# Patient Record
Sex: Female | Born: 1975 | Race: Black or African American | Hispanic: No | Marital: Married | Smoking: Never smoker
Health system: Southern US, Community
[De-identification: ages and names within clinical notes are randomized; demographics above are authoritative.]

## PROBLEM LIST (undated history)

## (undated) DIAGNOSIS — R51 Headache: Secondary | ICD-10-CM

## (undated) DIAGNOSIS — R519 Headache, unspecified: Secondary | ICD-10-CM

## (undated) DIAGNOSIS — J45909 Unspecified asthma, uncomplicated: Secondary | ICD-10-CM

## (undated) HISTORY — PX: TUBAL LIGATION: SHX77

## (undated) HISTORY — DX: Headache: R51

## (undated) HISTORY — DX: Headache, unspecified: R51.9

---

## 2010-05-08 ENCOUNTER — Emergency Department (INDEPENDENT_AMBULATORY_CARE_PROVIDER_SITE_OTHER): Payer: BC Managed Care – PPO

## 2010-05-08 ENCOUNTER — Emergency Department (HOSPITAL_BASED_OUTPATIENT_CLINIC_OR_DEPARTMENT_OTHER)
Admission: EM | Admit: 2010-05-08 | Discharge: 2010-05-08 | Disposition: A | Discharge status: Home / Self Care | Payer: BC Managed Care – PPO | Attending: Emergency Medicine | Admitting: Emergency Medicine

## 2010-05-08 DIAGNOSIS — R0789 Other chest pain: Secondary | ICD-10-CM | POA: Insufficient documentation

## 2010-05-08 DIAGNOSIS — R0602 Shortness of breath: Secondary | ICD-10-CM | POA: Insufficient documentation

## 2010-05-08 DIAGNOSIS — K224 Dyskinesia of esophagus: Secondary | ICD-10-CM | POA: Insufficient documentation

## 2010-05-08 DIAGNOSIS — R079 Chest pain, unspecified: Secondary | ICD-10-CM

## 2010-05-08 DIAGNOSIS — K2990 Gastroduodenitis, unspecified, without bleeding: Secondary | ICD-10-CM | POA: Insufficient documentation

## 2010-05-08 DIAGNOSIS — J45909 Unspecified asthma, uncomplicated: Secondary | ICD-10-CM | POA: Insufficient documentation

## 2010-05-08 DIAGNOSIS — K2970 Gastritis, unspecified, without bleeding: Secondary | ICD-10-CM | POA: Insufficient documentation

## 2010-05-08 LAB — CBC
MCH: 28.4 pg (ref 26.0–34.0)
MCHC: 34 g/dL (ref 30.0–36.0)
MCV: 83.6 fL (ref 78.0–100.0)
Platelets: 276 10*3/uL (ref 150–400)
RBC: 4.22 MIL/uL (ref 3.87–5.11)

## 2010-05-08 LAB — DIFFERENTIAL
Basophils Relative: 0 % (ref 0–1)
Eosinophils Absolute: 0.1 10*3/uL (ref 0.0–0.7)
Eosinophils Relative: 2 % (ref 0–5)
Lymphs Abs: 1.2 10*3/uL (ref 0.7–4.0)
Monocytes Absolute: 0.3 10*3/uL (ref 0.1–1.0)
Monocytes Relative: 7 % (ref 3–12)
Neutrophils Relative %: 65 % (ref 43–77)

## 2010-05-08 LAB — BASIC METABOLIC PANEL
BUN: 15 mg/dL (ref 6–23)
CO2: 23 mEq/L (ref 19–32)
Chloride: 112 mEq/L (ref 96–112)
Creatinine, Ser: 0.6 mg/dL (ref 0.4–1.2)
Glucose, Bld: 104 mg/dL — ABNORMAL HIGH (ref 70–99)
Potassium: 4.3 mEq/L (ref 3.5–5.1)

## 2010-05-08 LAB — D-DIMER, QUANTITATIVE: D-Dimer, Quant: 0.26 ug/mL-FEU (ref 0.00–0.48)

## 2010-07-13 ENCOUNTER — Emergency Department (INDEPENDENT_AMBULATORY_CARE_PROVIDER_SITE_OTHER): Payer: BC Managed Care – PPO

## 2010-07-13 ENCOUNTER — Emergency Department (HOSPITAL_BASED_OUTPATIENT_CLINIC_OR_DEPARTMENT_OTHER)
Admission: EM | Admit: 2010-07-13 | Discharge: 2010-07-13 | Disposition: A | Discharge status: Home / Self Care | Payer: BC Managed Care – PPO | Attending: Emergency Medicine | Admitting: Emergency Medicine

## 2010-07-13 DIAGNOSIS — M25579 Pain in unspecified ankle and joints of unspecified foot: Secondary | ICD-10-CM

## 2010-07-13 DIAGNOSIS — S93409A Sprain of unspecified ligament of unspecified ankle, initial encounter: Secondary | ICD-10-CM | POA: Insufficient documentation

## 2010-07-13 DIAGNOSIS — M79609 Pain in unspecified limb: Secondary | ICD-10-CM

## 2010-07-13 DIAGNOSIS — J45909 Unspecified asthma, uncomplicated: Secondary | ICD-10-CM | POA: Insufficient documentation

## 2010-07-13 DIAGNOSIS — M7730 Calcaneal spur, unspecified foot: Secondary | ICD-10-CM

## 2010-07-13 DIAGNOSIS — W19XXXA Unspecified fall, initial encounter: Secondary | ICD-10-CM

## 2010-07-13 DIAGNOSIS — W108XXA Fall (on) (from) other stairs and steps, initial encounter: Secondary | ICD-10-CM | POA: Insufficient documentation

## 2010-07-13 DIAGNOSIS — R609 Edema, unspecified: Secondary | ICD-10-CM

## 2011-08-23 ENCOUNTER — Inpatient Hospital Stay (HOSPITAL_COMMUNITY)
Admission: AD | Admit: 2011-08-23 | Discharge: 2011-08-23 | Disposition: A | Discharge status: Home / Self Care | Payer: BC Managed Care – PPO | Source: Ambulatory Visit | Attending: Obstetrics and Gynecology | Admitting: Obstetrics and Gynecology

## 2011-08-23 ENCOUNTER — Encounter (HOSPITAL_COMMUNITY): Payer: Self-pay

## 2011-08-23 DIAGNOSIS — N920 Excessive and frequent menstruation with regular cycle: Secondary | ICD-10-CM | POA: Insufficient documentation

## 2011-08-23 HISTORY — DX: Unspecified asthma, uncomplicated: J45.909

## 2011-08-23 LAB — URINE MICROSCOPIC-ADD ON

## 2011-08-23 LAB — CBC
HCT: 36.9 % (ref 36.0–46.0)
Hemoglobin: 12.1 g/dL (ref 12.0–15.0)
MCH: 28.3 pg (ref 26.0–34.0)
RBC: 4.27 MIL/uL (ref 3.87–5.11)

## 2011-08-23 LAB — URINALYSIS, ROUTINE W REFLEX MICROSCOPIC
Bilirubin Urine: NEGATIVE
Ketones, ur: NEGATIVE mg/dL
Nitrite: NEGATIVE
Protein, ur: NEGATIVE mg/dL
pH: 6 (ref 5.0–8.0)

## 2011-08-23 MED ORDER — ACETAMINOPHEN 500 MG PO TABS
1000.0000 mg | ORAL_TABLET | Freq: Once | ORAL | Status: AC
  Administered 2011-08-23: 1000 mg via ORAL
  Filled 2011-08-23: qty 2

## 2011-08-23 NOTE — MAU Note (Signed)
Patient states she started her period on 6-29 and has had some bleeding every day. States it was heavy today. Pad patient is wearing has a very small strip of dark blood. Has been having some sharp pain and has been feeling a "little woozy".

## 2011-08-23 NOTE — MAU Provider Note (Signed)
Janice Reeves y.o.G1P0101 @Unknown  by LMP Chief Complaint  Patient presents with  . Vaginal Bleeding  . Abdominal Pain     First Provider Initiated Contact with Patient 08/23/11 2129      SUBJECTIVE  HPI: HPI: Janice Reeves is a 36 y.o. year old [id number] female who presents to MAU reporting period since 08/11/11, heavy today. Also reports HA and light-headedness.  Denies syncope, passing clots, vaginal discharge, abd pain, fever, chills. Usually has 28 day cycles, moderate bleeding. Very distressed about heavy bleeding, staining clothes and wants to know why she has experienced this change in her cycle. Admits to very stressful past few weeks. BTL for contraception.   Past Medical History  Diagnosis Date  . Asthma    Past Surgical History  Procedure Date  . Cesarean section   . Tubal ligation    History   Social History  . Marital Status: Married    Spouse Name: N/A    Number of Children: N/A  . Years of Education: N/A   Occupational History  . Not on file.   Social History Main Topics  . Smoking status: Never Smoker   . Smokeless tobacco: Not on file  . Alcohol Use:   . Drug Use: No  . Sexually Active: Yes    Birth Control/ Protection: Surgical   Other Topics Concern  . Not on file   Social History Narrative  . No narrative on file   No current facility-administered medications on file prior to encounter.   No current outpatient prescriptions on file prior to encounter.   Allergies  Allergen Reactions  . Sulfa Antibiotics Rash    ROS: Pertinent items in HPI  OBJECTIVE Blood pressure 116/71, pulse 77, temperature 97.9 F (36.6 C), temperature source Oral, resp. rate 18, height 5' 2.75" (1.594 m), weight 95.165 kg (209 lb 12.8 oz), last menstrual period 08/11/2011, SpO2 99.00%. Patient Vitals for the past 24 hrs:  BP Temp Temp src Pulse Resp SpO2 Height Weight  08/23/11 2117 116/71 mmHg - - 77  - - - -  08/23/11 2115 126/76 mmHg - - 71  - - - -    08/23/11 2113 111/76 mmHg - - 75  18  - - -  08/23/11 1915 124/79 mmHg 97.9 F (36.6 C) Oral 80  18  99 % 5' 2.75" (1.594 m) 95.165 kg (209 lb 12.8 oz)   GENERAL: Well-developed, well-nourished female in mild distress, anxious.  HEENT: Normocephalic, good dentition HEART: normal rate RESP: normal effort ABDOMEN: Soft, nontender EXTREMITIES: Nontender, no edema NEURO: Alert and oriented SPECULUM EXAM: NEFG, small amount of blood noted, cervix clean BIMANUAL: cervix closed; uterus NSSP; no adnexal tenderness or masses. No cervical polyps.  LAB RESULTS  Results for orders placed during the hospital encounter of 08/23/11 (from the past 24 hour(s))  URINALYSIS, ROUTINE W REFLEX MICROSCOPIC     Status: Abnormal   Collection Time   08/23/11  7:20 PM      Component Value Range   Color, Urine RED (*) YELLOW   APPearance HAZY (*) CLEAR   Specific Gravity, Urine 1.010  1.005 - 1.030   pH 6.0  5.0 - 8.0   Glucose, UA NEGATIVE  NEGATIVE mg/dL   Hgb urine dipstick LARGE (*) NEGATIVE   Bilirubin Urine NEGATIVE  NEGATIVE   Ketones, ur NEGATIVE  NEGATIVE mg/dL   Protein, ur NEGATIVE  NEGATIVE mg/dL   Urobilinogen, UA 0.2  0.0 - 1.0 mg/dL   Nitrite NEGATIVE  NEGATIVE  Leukocytes, UA NEGATIVE  NEGATIVE  URINE MICROSCOPIC-ADD ON     Status: Abnormal   Collection Time   08/23/11  7:20 PM      Component Value Range   Squamous Epithelial / LPF FEW (*) RARE   RBC / HPF 0-2  <3 RBC/hpf   Bacteria, UA FEW (*) RARE  POCT PREGNANCY, URINE     Status: Normal   Collection Time   08/23/11  7:27 PM      Component Value Range   Preg Test, Ur NEGATIVE  NEGATIVE  CBC     Status: Normal   Collection Time   08/23/11  9:05 PM      Component Value Range   WBC 7.8  4.0 - 10.5 K/uL   RBC 4.27  3.87 - 5.11 MIL/uL   Hemoglobin 12.1  12.0 - 15.0 g/dL   HCT 16.1  09.6 - 04.5 %   MCV 86.4  78.0 - 100.0 fL   MCH 28.3  26.0 - 34.0 pg   MCHC 32.8  30.0 - 36.0 g/dL   RDW 40.9  81.1 - 91.4 %   Platelets 292   150 - 400 K/uL    IMAGING NA  ASSESSMENT  1. Menorrhagia     PLAN D/C home per consult w/ Dr. Marcelle Overlie. Per Dr. Marcelle Overlie pt instructed to call pt in am for work-in at office and to move up [place]. Ask Dr. Renaldo Fiddler to Rx meds for menorrhagia. Pt tearful.  Discussed possible causes of heavy prolonged bleeding including stress-related cycle changes, fibroids, polyps, anovulatory cycles.  Reassured pt that she is hemodynamically stable. Bleeding precautions Medication List  As of 08/24/2011  6:06 AM   TAKE these medications         dextromethorphan-guaiFENesin 30-600 MG per 12 hr tablet   Commonly known as: MUCINEX DM   Take 1 tablet by mouth every 12 (twelve) hours.      naproxen sodium 220 MG tablet   Commonly known as: ANAPROX   Take 220 mg by mouth 2 (two) times daily with a meal.           Follow-up Information    Schedule an appointment as soon as possible for a visit with Zelphia Cairo, MD.   Contact information:   [address], [address] [place] [address] [phone]       Follow up with [facility]. (As needed if symptoms worsen)    Contact information:   [address] [place] [address] [phone]        Dorathy Kinsman 08/24/2011 6:04 AM

## 2011-10-18 ENCOUNTER — Encounter (HOSPITAL_COMMUNITY): Payer: Self-pay | Admitting: Pharmacist

## 2011-10-30 ENCOUNTER — Encounter (HOSPITAL_COMMUNITY): Payer: Self-pay

## 2011-10-30 ENCOUNTER — Encounter (HOSPITAL_COMMUNITY)
Admission: RE | Admit: 2011-10-30 | Discharge: 2011-10-30 | Disposition: A | Discharge status: Home / Self Care | Payer: BC Managed Care – PPO | Source: Ambulatory Visit | Attending: Obstetrics and Gynecology | Admitting: Obstetrics and Gynecology

## 2011-10-30 LAB — CBC
HCT: 38.2 % (ref 36.0–46.0)
MCH: 27.5 pg (ref 26.0–34.0)
MCV: 87.4 fL (ref 78.0–100.0)
RBC: 4.37 MIL/uL (ref 3.87–5.11)
WBC: 7.3 10*3/uL (ref 4.0–10.5)

## 2011-10-30 NOTE — H&P (Signed)
Janice Reeves, THORNS NO.:  [id number]  MEDICAL RECORD NO.:  [id number]  LOCATION:  [facility]                         FACILITY:  [facility]  PHYSICIAN:  Zelphia Cairo, MD    DATE OF BIRTH:  04-06-1975  DATE OF ADMISSION:  10/08/2011 DATE OF DISCHARGE:                             HISTORY & PHYSICAL   HISTORY OF PRESENT ILLNESS:  A 36 year old G1, P2, who presents today for surgical management of persistent right ovarian cyst.  She has had persistent right lower quadrant pain without resolution of her ovarian cyst on ultrasound.  PAST MEDICAL HISTORY:  Negative.  PAST SURGICAL HISTORY:  Cesarean section, twin, 2008.  SOCIAL HISTORY:  Negative for tobacco, alcohol, or drug use.  ALLERGIES:  SULFA.  MEDICATIONS:  Multivitamin.  FAMILY HISTORY:  Breast and pancreatic cancer, heart disease, arthritis, and diabetes.  PHYSICAL EXAMINATION:  GENERAL:  She is in no acute distress. HEART:  Regular rate and rhythm. LUNGS:  Clear bilaterally. ABDOMEN:  Soft, nontender, nondistended.  No rebound or guarding. PELVIC:  Normal external female genitalia.  Uterus is mobile, mildly tender with manipulation.  She does have fullness and tenderness in the right lower quadrant on bimanual exam.  DIAGNOSTIC DATA:  Ultrasound showed a 5.8-cm ovarian cyst with diffuse internal echoes, consistent with hemorrhagic ovarian cyst versus endometrioma.  CA-125 was 20.  ASSESSMENT:  Persistent right ovarian mass.  PLAN:  Laparoscopic right ovarian cystectomy with possible right oophorectomy.    Zelphia Cairo, MD    GA/MEDQ  D:  10/29/2011  T:  10/30/2011  Job:  [id number]

## 2011-10-30 NOTE — Patient Instructions (Addendum)
Your procedure is scheduled on:11/06/11  Enter through the Main Entrance at :6AM Pick up desk phone and dial [phone] and inform us of your arrival.  Please call [phone] if you have any problems the morning of surgery.  Remember: Do not eat after midnight:MONDAY Do not drink after:MIDNIGHT MONDAY  Take these meds the morning of surgery with a sip of water:NONE DO NOT wear jewelry, eye make-up, lipstick,body lotion, or dark fingernail polish. Do not shave for 48 hours prior to surgery.  Patients discharged on the day of surgery will not be allowed to drive home.

## 2011-11-05 MED ORDER — DEXTROSE 5 % IV SOLN
2.0000 g | INTRAVENOUS | Status: AC
  Administered 2011-11-06: 2 g via INTRAVENOUS
  Filled 2011-11-05: qty 2

## 2011-11-06 ENCOUNTER — Encounter (HOSPITAL_COMMUNITY)
Admission: RE | Disposition: A | Discharge status: Home / Self Care | Payer: Self-pay | Source: Ambulatory Visit | Attending: Obstetrics and Gynecology

## 2011-11-06 ENCOUNTER — Encounter (HOSPITAL_COMMUNITY): Payer: Self-pay | Admitting: Anesthesiology

## 2011-11-06 ENCOUNTER — Ambulatory Visit (HOSPITAL_COMMUNITY): Payer: BC Managed Care – PPO | Admitting: Anesthesiology

## 2011-11-06 ENCOUNTER — Ambulatory Visit (HOSPITAL_COMMUNITY)
Admission: RE | Admit: 2011-11-06 | Discharge: 2011-11-06 | Disposition: A | Discharge status: Home / Self Care | Payer: BC Managed Care – PPO | Source: Ambulatory Visit | Attending: Obstetrics and Gynecology | Admitting: Obstetrics and Gynecology

## 2011-11-06 DIAGNOSIS — Z01812 Encounter for preprocedural laboratory examination: Secondary | ICD-10-CM | POA: Insufficient documentation

## 2011-11-06 DIAGNOSIS — Z01818 Encounter for other preprocedural examination: Secondary | ICD-10-CM | POA: Insufficient documentation

## 2011-11-06 DIAGNOSIS — N801 Endometriosis of ovary: Secondary | ICD-10-CM | POA: Insufficient documentation

## 2011-11-06 DIAGNOSIS — N80109 Endometriosis of ovary, unspecified side, unspecified depth: Secondary | ICD-10-CM | POA: Insufficient documentation

## 2011-11-06 DIAGNOSIS — N9489 Other specified conditions associated with female genital organs and menstrual cycle: Secondary | ICD-10-CM | POA: Insufficient documentation

## 2011-11-06 DIAGNOSIS — N949 Unspecified condition associated with female genital organs and menstrual cycle: Secondary | ICD-10-CM | POA: Insufficient documentation

## 2011-11-06 HISTORY — PX: LAPAROSCOPY: SHX197

## 2011-11-06 HISTORY — PX: OVARIAN CYST REMOVAL: SHX89

## 2011-11-06 HISTORY — PX: SALPINGOOPHORECTOMY: SHX82

## 2011-11-06 LAB — TYPE AND SCREEN
ABO/RH(D): B POS
Antibody Screen: NEGATIVE

## 2011-11-06 SURGERY — LAPAROSCOPY OPERATIVE
Anesthesia: Epidural | Site: Abdomen | Laterality: Right | Wound class: Clean Contaminated

## 2011-11-06 MED ORDER — NEOSTIGMINE METHYLSULFATE 1 MG/ML IJ SOLN
INTRAMUSCULAR | Status: AC
  Filled 2011-11-06: qty 10

## 2011-11-06 MED ORDER — MIDAZOLAM HCL 5 MG/5ML IJ SOLN
INTRAMUSCULAR | Status: DC | PRN
  Administered 2011-11-06: 2 mg via INTRAVENOUS

## 2011-11-06 MED ORDER — HYDROMORPHONE HCL PF 1 MG/ML IJ SOLN
INTRAMUSCULAR | Status: AC
  Administered 2011-11-06: 0.5 mg via INTRAVENOUS
  Filled 2011-11-06: qty 1

## 2011-11-06 MED ORDER — LIDOCAINE HCL (CARDIAC) 20 MG/ML IV SOLN
INTRAVENOUS | Status: DC | PRN
  Administered 2011-11-06: 40 mg via INTRAVENOUS

## 2011-11-06 MED ORDER — FENTANYL CITRATE 0.05 MG/ML IJ SOLN
INTRAMUSCULAR | Status: AC
  Filled 2011-11-06: qty 5

## 2011-11-06 MED ORDER — FENTANYL CITRATE 0.05 MG/ML IJ SOLN
INTRAMUSCULAR | Status: DC | PRN
  Administered 2011-11-06: 150 ug via INTRAVENOUS
  Administered 2011-11-06: 100 ug via INTRAVENOUS

## 2011-11-06 MED ORDER — ONDANSETRON HCL 4 MG/2ML IJ SOLN: 4.0000 mg | Freq: Once | INTRAMUSCULAR | Status: DC | PRN

## 2011-11-06 MED ORDER — BUPIVACAINE HCL (PF) 0.25 % IJ SOLN
INTRAMUSCULAR | Status: DC | PRN
  Administered 2011-11-06: 7 mL

## 2011-11-06 MED ORDER — KETOROLAC TROMETHAMINE 30 MG/ML IJ SOLN: 15.0000 mg | Freq: Once | INTRAMUSCULAR | Status: DC | PRN

## 2011-11-06 MED ORDER — ROCURONIUM BROMIDE 50 MG/5ML IV SOLN
INTRAVENOUS | Status: AC
  Filled 2011-11-06: qty 1

## 2011-11-06 MED ORDER — BUPIVACAINE HCL (PF) 0.25 % IJ SOLN
INTRAMUSCULAR | Status: AC
  Filled 2011-11-06: qty 30

## 2011-11-06 MED ORDER — IBUPROFEN 200 MG PO TABS: 600.0000 mg | ORAL_TABLET | Freq: Four times a day (QID) | ORAL | Status: AC | PRN

## 2011-11-06 MED ORDER — DEXAMETHASONE SODIUM PHOSPHATE 10 MG/ML IJ SOLN
INTRAMUSCULAR | Status: AC
  Filled 2011-11-06: qty 1

## 2011-11-06 MED ORDER — GLYCOPYRROLATE 0.2 MG/ML IJ SOLN
INTRAMUSCULAR | Status: DC | PRN
  Administered 2011-11-06: 0.6 mg via INTRAVENOUS

## 2011-11-06 MED ORDER — HYDROCODONE-ACETAMINOPHEN 5-500 MG PO TABS: 1.0000 | ORAL_TABLET | Freq: Four times a day (QID) | ORAL | Status: AC | PRN

## 2011-11-06 MED ORDER — PROPOFOL 10 MG/ML IV BOLUS
INTRAVENOUS | Status: DC | PRN
  Administered 2011-11-06: 50 mg via INTRAVENOUS
  Administered 2011-11-06: 150 mg via INTRAVENOUS

## 2011-11-06 MED ORDER — KETOROLAC TROMETHAMINE 30 MG/ML IJ SOLN
INTRAMUSCULAR | Status: AC
  Filled 2011-11-06: qty 1

## 2011-11-06 MED ORDER — NEOSTIGMINE METHYLSULFATE 1 MG/ML IJ SOLN
INTRAMUSCULAR | Status: DC | PRN
  Administered 2011-11-06: 3 mg via INTRAVENOUS

## 2011-11-06 MED ORDER — MEPERIDINE HCL 25 MG/ML IJ SOLN: 6.2500 mg | INTRAMUSCULAR | Status: DC | PRN

## 2011-11-06 MED ORDER — KETOROLAC TROMETHAMINE 30 MG/ML IJ SOLN
INTRAMUSCULAR | Status: DC | PRN
  Administered 2011-11-06: 30 mg via INTRAVENOUS

## 2011-11-06 MED ORDER — ROCURONIUM BROMIDE 100 MG/10ML IV SOLN
INTRAVENOUS | Status: DC | PRN
  Administered 2011-11-06: 35 mg via INTRAVENOUS

## 2011-11-06 MED ORDER — ONDANSETRON HCL 4 MG/2ML IJ SOLN
INTRAMUSCULAR | Status: AC
  Filled 2011-11-06: qty 2

## 2011-11-06 MED ORDER — PROPOFOL 10 MG/ML IV EMUL
INTRAVENOUS | Status: AC
  Filled 2011-11-06: qty 20

## 2011-11-06 MED ORDER — ONDANSETRON HCL 4 MG/2ML IJ SOLN
INTRAMUSCULAR | Status: DC | PRN
  Administered 2011-11-06: 4 mg via INTRAVENOUS

## 2011-11-06 MED ORDER — LIDOCAINE HCL (CARDIAC) 20 MG/ML IV SOLN
INTRAVENOUS | Status: AC
  Filled 2011-11-06: qty 5

## 2011-11-06 MED ORDER — LACTATED RINGERS IV SOLN
INTRAVENOUS | Status: DC
  Administered 2011-11-06 (×3): via INTRAVENOUS

## 2011-11-06 MED ORDER — HYDROMORPHONE HCL PF 1 MG/ML IJ SOLN
0.2500 mg | INTRAMUSCULAR | Status: DC | PRN
  Administered 2011-11-06 (×2): 0.5 mg via INTRAVENOUS

## 2011-11-06 MED ORDER — MIDAZOLAM HCL 2 MG/2ML IJ SOLN
INTRAMUSCULAR | Status: AC
  Filled 2011-11-06: qty 2

## 2011-11-06 MED ORDER — GLYCOPYRROLATE 0.2 MG/ML IJ SOLN
INTRAMUSCULAR | Status: AC
  Filled 2011-11-06: qty 2

## 2011-11-06 MED ORDER — DEXAMETHASONE SODIUM PHOSPHATE 10 MG/ML IJ SOLN
INTRAMUSCULAR | Status: DC | PRN
  Administered 2011-11-06: 10 mg via INTRAVENOUS

## 2011-11-06 MED ORDER — LACTATED RINGERS IR SOLN
Status: DC | PRN
  Administered 2011-11-06: 1

## 2011-11-06 SURGICAL SUPPLY — 26 items
CATH ROBINSON RED A/P 16FR (CATHETERS) ×3 IMPLANT
CHLORAPREP W/TINT 26ML (MISCELLANEOUS) ×3 IMPLANT
CLOTH BEACON ORANGE TIMEOUT ST (SAFETY) ×3 IMPLANT
DERMABOND ADVANCED (GAUZE/BANDAGES/DRESSINGS) ×1
DERMABOND ADVANCED .7 DNX12 (GAUZE/BANDAGES/DRESSINGS) ×2 IMPLANT
GLOVE BIO SURGEON STRL SZ 6.5 (GLOVE) ×6 IMPLANT
GLOVE BIOGEL PI IND STRL 6.5 (GLOVE) ×2 IMPLANT
GLOVE BIOGEL PI IND STRL 7.0 (GLOVE) ×4 IMPLANT
GLOVE BIOGEL PI INDICATOR 6.5 (GLOVE) ×1
GLOVE BIOGEL PI INDICATOR 7.0 (GLOVE) ×2
GLOVE ECLIPSE 6.0 STRL STRAW (GLOVE) ×6 IMPLANT
GOWN PREVENTION PLUS LG XLONG (DISPOSABLE) ×6 IMPLANT
NS IRRIG 1000ML POUR BTL (IV SOLUTION) ×3 IMPLANT
PACK LAPAROSCOPY BASIN (CUSTOM PROCEDURE TRAY) ×3 IMPLANT
POUCH SPECIMEN RETRIEVAL 10MM (ENDOMECHANICALS) ×3 IMPLANT
PROTECTOR NERVE ULNAR (MISCELLANEOUS) ×3 IMPLANT
SEALER TISSUE G2 CVD JAW 45CM (ENDOMECHANICALS) ×3 IMPLANT
SET IRRIG TUBING LAPAROSCOPIC (IRRIGATION / IRRIGATOR) ×3 IMPLANT
SUT VIC AB 3-0 PS2 18 (SUTURE) ×1
SUT VIC AB 3-0 PS2 18XBRD (SUTURE) ×2 IMPLANT
SUT VICRYL 0 UR6 27IN ABS (SUTURE) ×3 IMPLANT
TOWEL OR 17X24 6PK STRL BLUE (TOWEL DISPOSABLE) ×6 IMPLANT
TROCAR Z-THREAD BLADED 5X100MM (TROCAR) ×3 IMPLANT
TROCAR Z-THREAD FIOS 11X100 BL (TROCAR) ×6 IMPLANT
WARMER LAPAROSCOPE (MISCELLANEOUS) ×3 IMPLANT
WATER STERILE IRR 1000ML POUR (IV SOLUTION) ×3 IMPLANT

## 2011-11-06 NOTE — Anesthesia Preprocedure Evaluation (Addendum)
Anesthesia Evaluation  Patient identified by MRN, date of birth, ID band Patient awake    Reviewed: Allergy & Precautions, H&P , NPO status , Patient's Chart, lab work & pertinent test results  Airway Mallampati: I TM Distance: >3 FB Neck ROM: full    Dental No notable dental hx. (+) Teeth Intact   Pulmonary    Pulmonary exam normal       Cardiovascular negative cardio ROS      Neuro/Psych negative neurological ROS  negative psych ROS   GI/Hepatic negative GI ROS, Neg liver ROS,   Endo/Other  Morbid obesity  Renal/GU negative Renal ROS  negative genitourinary   Musculoskeletal negative musculoskeletal ROS (+)   Abdominal (+) + obese,   Peds negative pediatric ROS (+)  Hematology negative hematology ROS (+)   Anesthesia Other Findings   Reproductive/Obstetrics negative OB ROS                           Anesthesia Physical Anesthesia Plan  ASA: III  Anesthesia Plan: General   Post-op Pain Management:    Induction: Intravenous  Airway Management Planned: Oral ETT  Additional Equipment:   Intra-op Plan:   Post-operative Plan: Extubation in OR  Informed Consent: I have reviewed the patients History and Physical, chart, labs and discussed the procedure including the risks, benefits and alternatives for the proposed anesthesia with the patient or authorized representative who has indicated his/her understanding and acceptance.   Dental Advisory Given  Plan Discussed with: CRNA and Surgeon  Anesthesia Plan Comments:        Anesthesia Quick Evaluation

## 2011-11-06 NOTE — Anesthesia Postprocedure Evaluation (Signed)
  Anesthesia Post-op Note  Patient: Janice Reeves  Procedure(s) Performed: Procedure(s) (LRB) with comments: LAPAROSCOPY OPERATIVE (N/A) OVARIAN CYSTECTOMY (Right) SALPINGO OOPHERECTOMY (Right) Patient is awake and responsive. Pain and nausea are reasonably well controlled. Vital signs are stable and clinically acceptable. Oxygen saturation is clinically acceptable. There are no apparent anesthetic complications at this time. Patient is ready for discharge.

## 2011-11-06 NOTE — Transfer of Care (Signed)
Immediate Anesthesia Transfer of Care Note  Patient: Janice Reeves  Procedure(s) Performed: Procedure(s) (LRB) with comments: LAPAROSCOPY OPERATIVE (N/A) OVARIAN CYSTECTOMY (Right) SALPINGO OOPHERECTOMY (Right)  Patient Location: PACU  Anesthesia Type: General  Level of Consciousness: sedated  Airway & Oxygen Therapy: Patient Spontanous Breathing and Patient connected to nasal cannula oxygen  Post-op Assessment: Report given to PACU RN and Post -op Vital signs reviewed and stable  Post vital signs: stable  Complications: No apparent anesthesia complications

## 2011-11-06 NOTE — Progress Notes (Signed)
Plan of care d/w pt.   Questions answered R/b/a discussed and informed consent

## 2011-11-07 ENCOUNTER — Encounter (HOSPITAL_COMMUNITY): Payer: Self-pay | Admitting: Obstetrics and Gynecology

## 2011-11-07 NOTE — Op Note (Signed)
NAMESHATAYA, WINKLES NO.:  [id number]  MEDICAL RECORD NO.:  [id number]  LOCATION:  [facility]                          [facility]ACILITY:  [facility]  PHYSICIAN:  Zelphia Cairo, MD    DATE OF BIRTH:  12/22/1975  DATE OF PROCEDURE: DATE OF DISCHARGE:  11/06/2011                              OPERATIVE REPORT   PREOPERATIVE DIAGNOSES: 1. Pelvic pain. 2. Right adnexal mass.  POSTOPERATIVE DIAGNOSES: 1. Pelvic pain. 2. Right adnexal mass. 3. Suspect right endometrioma.  PROCEDURE:  Operative laparoscopy with right salpingo-oophorectomy.  SURGEON:  Zelphia Cairo, MD  ASSISTANT:  None.  ANESTHESIA:  General.  SPECIMEN:  Right ovary and portion of right fallopian tube.  COMPLICATIONS:  None.  CONDITION:  Stable to recovery room.  PROCEDURE:  The patient was taken to the operating room.  After informed consent was obtained, she was given general anesthesia, placed in the dorsal lithotomy position with Allen stirrups and prepped and draped in sterile fashion.  An in and out catheter was used to drain her bladder. Urine was clear.  Bivalve speculum was placed in the vagina.  Single- tooth tenaculum placed on the anterior lip of the cervix.  Hulka clamp was placed through the cervix and attached to the anterior lip.  The tenaculum and speculum were removed and our attention was turned to the abdomen.  A small infraumbilical skin incision was made with a scalpel and extended bluntly to the level of the fascia using a Kelly clamp. Optical trocar was then inserted under direct visualization.  Once intraperitoneal placement was confirmed, CO2 was turned on and the survey of the pelvis was performed.  Uterus, left fallopian tube, and left ovary appeared normal.  Right ovary was enlarged and the distal portion of the right fallopian tube was adhered to the right ovary. Right ureter was identified.  Peristalsis was noted.  In the left ovarian fossa, there was a small implant of  endometriosis just adjacent to the pulsations of the left pelvic vessels.  There was also an implant of endometriosis on the right pelvic sidewall overlying the right ureter.  A suprapubic incision was then made with a scalpel and trocar was inserted under direct visualization.  The right distal fallopian tube and right adnexal mass were grasped, tented upwards, and maneuvered toward midline.  Endometrioma consumed the majority of the ovary.  The Enseal device was used to cauterize and cut the right infundibulopelvic ligament.  This was continued down the mesosalpinx, staying just adjacent to the ovary and fallopian tube.  The right utero-ovarian ligament was cauterized and cut and the right adnexa was amputated from the uterus.  Specimen bag was placed through the suprapubic port and the mass was placed into the specimen bag laparoscopically.  A needle aspirator was then inserted through the operative port of the laparoscope and dark brown chocolate appearing fluid was aspirated from the right adnexal mass.  This allowed the mass to collapse somewhat which was then removed through the suprapubic port in the bag.  The pelvis was then copiously irrigated with saline.  Pedicles were evaluated and noted to be hemostatic.  All instruments and trocars were then removed from the abdomen.  A  deep stitch was placed in the suprapubic and infraumbilical incision with Vicryl.  The skin was reapproximated with Vicryl and Dermabond was placed over both incisions. Hulka clamp was removed from the cervix.  The patient was extubated and taken to the recovery room in stable condition.  Sponge, lap, needle, and instrument counts were correct x2.     Zelphia Cairo, MD     GA/MEDQ  D:  11/06/2011  T:  11/07/2011  Job:  [id number]

## 2013-12-14 ENCOUNTER — Encounter (HOSPITAL_COMMUNITY): Payer: Self-pay | Admitting: Obstetrics and Gynecology

## 2014-05-21 ENCOUNTER — Other Ambulatory Visit: Payer: Self-pay | Admitting: Family Medicine

## 2014-05-21 DIAGNOSIS — N83209 Unspecified ovarian cyst, unspecified side: Secondary | ICD-10-CM

## 2014-05-21 DIAGNOSIS — R109 Unspecified abdominal pain: Secondary | ICD-10-CM

## 2014-05-24 ENCOUNTER — Ambulatory Visit
Admission: RE | Admit: 2014-05-24 | Discharge: 2014-05-24 | Disposition: A | Discharge status: Home / Self Care | Payer: BLUE CROSS/BLUE SHIELD | Source: Ambulatory Visit | Attending: Family Medicine | Admitting: Family Medicine

## 2014-05-24 DIAGNOSIS — R109 Unspecified abdominal pain: Secondary | ICD-10-CM

## 2014-05-25 ENCOUNTER — Ambulatory Visit
Admission: RE | Admit: 2014-05-25 | Discharge: 2014-05-25 | Disposition: A | Discharge status: Home / Self Care | Payer: BLUE CROSS/BLUE SHIELD | Source: Ambulatory Visit | Attending: Family Medicine | Admitting: Family Medicine

## 2014-05-25 DIAGNOSIS — N83209 Unspecified ovarian cyst, unspecified side: Secondary | ICD-10-CM

## 2015-07-01 ENCOUNTER — Telehealth: Payer: Self-pay | Admitting: Neurology

## 2015-07-01 NOTE — Telephone Encounter (Signed)
I spoke to Dr. Knox RoyaltyEnrico Jones at urgent care.   She has had 2 severe headaches this week, both with some neurologic symptoms. The one today is more severe with facial numbness and mild hand weakness and numbness improved but the hand weakness has continued. Advised to get an MRI of the brain with and without contrast and that the office will be able to see her next week.

## 2015-07-01 NOTE — Telephone Encounter (Signed)
Dr Knox RoyaltyEnrico Jones [phone] press [phone] for operator. He is wanting to speak with you about this pt, he thinks she may have MS and wants to order the correct MRI.

## 2015-07-04 ENCOUNTER — Encounter: Payer: Self-pay | Admitting: *Deleted

## 2015-07-04 ENCOUNTER — Ambulatory Visit (INDEPENDENT_AMBULATORY_CARE_PROVIDER_SITE_OTHER): Payer: PRIVATE HEALTH INSURANCE | Admitting: Diagnostic Neuroimaging

## 2015-07-04 VITALS — BP 115/73 | HR 66 | Ht 63.0 in | Wt 193.0 lb

## 2015-07-04 DIAGNOSIS — G43109 Migraine with aura, not intractable, without status migrainosus: Secondary | ICD-10-CM | POA: Diagnosis not present

## 2015-07-04 NOTE — Progress Notes (Signed)
[facility]  PATIENT: Janice Reeves DOB: 1975-04-30  REFERRING CLINICIAN: Kristie Cowman  HISTORY FROM: patient  REASON FOR VISIT: new consult    HISTORICAL  CHIEF COMPLAINT:  Chief Complaint  Patient presents with  . Headache    rm 7, New Pt, "migraine HA; tingling & numbness in hands/feet, skin feels tight, hard to grip. Face by mouth is numb, tongue numb. x 1 week."  . Numbness    HISTORY OF PRESENT ILLNESS:   40 year old left handed female here for evaluation of headaches. Patient reports new onset of symptoms on 06/27/15 starting with right for head headache, then right face and left hand numbness, bilateral foot numbness, kaleidoscope and pixilated vision. Symptoms affected face and tongue. Symptoms resolved by the next day. On 07/01/15 patient recurrence of symptoms including left face and right for head numbness, headache, vision changes. Patient had associated nausea, photophobia, sore severe throbbing pain.  Patient has never had these types of symptoms before. Patient's mother has had possible migraine headaches in the past.  No specific triggering or aggravating factors other than slightly increased stress since October 2016.   REVIEW OF SYSTEMS: Full 14 system review of systems performed and negative with exception of: Memory loss confusion numbness weakness dizziness blurred vision.  ALLERGIES: Allergies  Allergen Reactions  . Dairy Aid [Lactase]     Cow milk  . Gluten Meal   . Sulfa Antibiotics Rash    HOME MEDICATIONS: Outpatient Prescriptions Prior to Visit  Medication Sig Dispense Refill  . b complex vitamins tablet Take 1 tablet by mouth daily.    Marland Kitchen ibuprofen (ADVIL) 200 MG tablet Take 3 tablets (600 mg total) by mouth every 6 (six) hours as needed for pain. 30 tablet 0  . Multiple Vitamins-Minerals (MULTIVITAMIN WITH MINERALS) tablet Take 1 tablet by mouth daily.    . fish oil-omega-3 fatty acids 1000 MG capsule Take 2 g by mouth  daily.    Marland Kitchen HYDROcodone-acetaminophen (VICODIN) 5-500 MG per tablet Take 1-2 tablets by mouth every 6 (six) hours as needed for pain. (Patient not taking: Reported on 07/04/2015) 30 tablet 0   No facility-administered medications prior to visit.    PAST MEDICAL HISTORY: Past Medical History  Diagnosis Date  . Asthma   . Headache     PAST SURGICAL HISTORY: Past Surgical History  Procedure Laterality Date  . Cesarean section    . Tubal ligation    . Laparoscopy  11/06/2011    Procedure: LAPAROSCOPY OPERATIVE;  Surgeon: Marylynn Pearson, MD;  Location: [facility];  Service: Gynecology;  Laterality: N/A;  . Ovarian cyst removal  11/06/2011    Procedure: OVARIAN CYSTECTOMY;  Surgeon: Marylynn Pearson, MD;  Location: [facility];  Service: Gynecology;  Laterality: Right;  . Salpingoophorectomy  11/06/2011    Procedure: SALPINGO OOPHERECTOMY;  Surgeon: Marylynn Pearson, MD;  Location: [facility];  Service: Gynecology;  Laterality: Right;    FAMILY HISTORY: Family History  Problem Relation Age of Onset  . Other Neg Hx   . Cancer Maternal Grandmother   . Hypertension Mother   . Hypertension Brother   . Diabetes Father   . Cancer Father   . Diabetes Paternal Grandmother     SOCIAL HISTORY:  Social History   Social History  . Marital Status: Married    Spouse Name: Eddie Dibbles  . Number of Children: 2  . Years of Education: PhD   Occupational History  .      Project management- [employer]  Social History Main Topics  . Smoking status: Never Smoker   . Smokeless tobacco: Not on file  . Alcohol Use: Yes     Comment: RARELY  . Drug Use: No  . Sexual Activity: Yes    Birth Control/ Protection: Surgical   Other Topics Concern  . Not on file   Social History Narrative   Lives with husband, 2 children   Caffeine use- coffee 10 oz day max     PHYSICAL EXAM  GENERAL EXAM/CONSTITUTIONAL: Vitals:  Filed Vitals:   07/04/15 0824  BP: 115/73  Pulse: 66  Height: 5' 3"  (1.6 m)  Weight:  193 lb (87.544 kg)     Body mass index is 34.2 kg/(m^2).  Visual Acuity Screening   Right eye Left eye Both eyes  Without correction:     With correction: 20/20 20/20      Patient is in no distress; well developed, nourished and groomed; neck is supple  CARDIOVASCULAR:  Examination of carotid arteries is normal; no carotid bruits  Regular rate and rhythm, no murmurs  Examination of peripheral vascular system by observation and palpation is normal  EYES:  Ophthalmoscopic exam of optic discs and posterior segments is normal; no papilledema or hemorrhages  MUSCULOSKELETAL:  Gait, strength, tone, movements noted in Neurologic exam below  NEUROLOGIC: MENTAL STATUS:  No flowsheet data found.  awake, alert, oriented to person, place and time  recent and remote memory intact  normal attention and concentration  language fluent, comprehension intact, naming intact,   fund of knowledge appropriate  CRANIAL NERVE:   2nd - no papilledema on fundoscopic exam  2nd, 3rd, 4th, 6th - pupils equal and reactive to light, visual fields full to confrontation, extraocular muscles intact, no nystagmus  5th - facial sensation symmetric  7th - facial strength symmetric  8th - hearing intact  9th - palate elevates symmetrically, uvula midline  11th - shoulder shrug symmetric  12th - tongue protrusion midline  MOTOR:   normal bulk and tone, full strength in the BUE, BLE  SENSORY:   normal and symmetric to light touch, temperature, vibration  COORDINATION:   finger-nose-finger, fine finger movements normal  REFLEXES:   deep tendon reflexes present and symmetric  GAIT/STATION:   narrow based gait; able to walk tandem; romberg is negative    DIAGNOSTIC DATA (LABS, IMAGING, TESTING) - I reviewed patient records, labs, notes, testing and imaging myself where available.  Lab Results  Component Value Date   WBC 7.3 10/30/2011   HGB 12.0 10/30/2011   HCT 38.2  10/30/2011   MCV 87.4 10/30/2011   PLT 303 10/30/2011      Component Value Date/Time   NA 143 05/08/2010 0751   K 4.3 05/08/2010 0751   CL 112 05/08/2010 0751   CO2 23 05/08/2010 0751   GLUCOSE 104* 05/08/2010 0751   BUN 15 05/08/2010 0751   CREATININE .6 05/08/2010 0751   CALCIUM 8.6 05/08/2010 0751   GFRNONAA >60 05/08/2010 0751   GFRAA  05/08/2010 0751    >60        The eGFR has been calculated using the MDRD equation. This calculation has not been validated in all clinical situations. eGFR's persistently <60 mL/min signify possible Chronic Kidney Disease.   No results found for: CHOL, HDL, LDLCALC, LDLDIRECT, TRIG, CHOLHDL No results found for: HGBA1C No results found for: VITAMINB12 No results found for: TSH   05/08/15 CXR [I reviewed images myself and agree with interpretation. -VRP]  - No  active disease.     ASSESSMENT AND PLAN  40 y.o. year old female here with new onset numbness, headache, vision changes highly suspicious for migraine headache with aura. Due to new onset nature of symptoms with focal neurologic symptoms, we'll check MRI of the brain to rule out secondary cause. This is artifact ordered by PCP. Offered migraine specific medications although patient would like to hold off for now and use over-the-counter medications.   Ddx: migraine vs secondary headache  1. Migraine with aura and without status migrainosus, not intractable     PLAN: - use ibuprofen / tylenol / excedrin migraine as needed for breakthrough headaches - follow up MRI brain  Return in about 6 weeks (around 08/15/2015).    Penni Bombard, MD 9/77/4142, 3:95 AM Certified in Neurology, Neurophysiology and Neuroimaging  [facility] [address], [place], [place] [address] [phone]

## 2015-07-04 NOTE — Patient Instructions (Addendum)
Thank you for coming to see [place] at [facility]. I hope we have been able to provide you high quality care today.  You may receive a patient satisfaction survey over the next few weeks. We would appreciate your feedback and comments so that we may continue to improve ourselves and the health of our patients.  - use ibuprofen / tylenol / excedrin migraine as needed for breakthrough headaches - follow up MRI brain   ~~~~~~~~~~~~~~~~~~~~~~~~~~~~~~~~~~~~~~~~~~~~~~~~~~~~~~~~~~~~~~~~~  DR. Cambell Stanek'S GUIDE TO HAPPY AND HEALTHY LIVING These are some of my general health and wellness recommendations. Some of them may apply to you better than others. Please use common sense as you try these suggestions and feel free to ask me any questions.   ACTIVITY/FITNESS Mental, social, emotional and physical stimulation are very important for brain and body health. Try learning a new activity (arts, music, language, sports, games).  Keep moving your body to the best of your abilities. You can do this at home, inside or outside, the park, community center, gym or anywhere you like. Consider a physical therapist or personal trainer to get started. Consider the app Sworkit. Fitness trackers such as smart-watches, smart-phones or Fitbits can help as well.   NUTRITION Eat more plants: colorful vegetables, nuts, seeds and berries.  Eat less sugar, salt, preservatives and processed foods.  Avoid toxins such as cigarettes and alcohol.  Drink water when you are thirsty. Warm water with a slice of lemon is an excellent morning drink to start the day.  Consider these websites for more information [employer] Nutrition Source (https://www.henry-hernandez.biz/) [employer] Nutrition (WindowBlog.ch)   RELAXATION Consider practicing mindfulness meditation or other relaxation techniques such as deep breathing, prayer, yoga, tai chi, massage. See website mindful.org  or the apps Headspace or Calm to help get started.   SLEEP Try to get at least 7-8+ hours sleep per day. Regular exercise and reduced caffeine will help you sleep better. Practice good sleep hygeine techniques. See website sleep.org for more information.   PLANNING Prepare estate planning, living will, healthcare POA documents. Sometimes this is best planned with the help of an attorney. Theconversationproject.org and agingwithdignity.org are excellent resources.

## 2015-08-17 ENCOUNTER — Ambulatory Visit: Payer: PRIVATE HEALTH INSURANCE | Admitting: Diagnostic Neuroimaging

## 2015-08-18 ENCOUNTER — Encounter: Payer: Self-pay | Admitting: Diagnostic Neuroimaging

## 2017-10-25 ENCOUNTER — Other Ambulatory Visit: Payer: Self-pay | Admitting: Chiropractic Medicine

## 2017-10-25 DIAGNOSIS — M5416 Radiculopathy, lumbar region: Secondary | ICD-10-CM

## 2017-10-29 ENCOUNTER — Ambulatory Visit
Admission: RE | Admit: 2017-10-29 | Discharge: 2017-10-29 | Disposition: A | Discharge status: Home / Self Care | Payer: 59 | Source: Ambulatory Visit | Attending: Chiropractic Medicine | Admitting: Chiropractic Medicine

## 2017-10-29 DIAGNOSIS — M5416 Radiculopathy, lumbar region: Secondary | ICD-10-CM

## 2017-12-10 ENCOUNTER — Other Ambulatory Visit: Payer: Self-pay | Admitting: Nurse Practitioner

## 2017-12-10 DIAGNOSIS — Z1231 Encounter for screening mammogram for malignant neoplasm of breast: Secondary | ICD-10-CM

## 2018-01-21 ENCOUNTER — Ambulatory Visit
Admission: RE | Admit: 2018-01-21 | Discharge: 2018-01-21 | Disposition: A | Discharge status: Home / Self Care | Payer: 59 | Source: Ambulatory Visit | Attending: Nurse Practitioner | Admitting: Nurse Practitioner

## 2018-01-21 DIAGNOSIS — Z1231 Encounter for screening mammogram for malignant neoplasm of breast: Secondary | ICD-10-CM

## 2018-01-24 ENCOUNTER — Encounter: Payer: Self-pay | Admitting: Obstetrics and Gynecology

## 2018-01-24 DIAGNOSIS — N941 Unspecified dyspareunia: Secondary | ICD-10-CM | POA: Insufficient documentation

## 2018-01-24 DIAGNOSIS — N801 Endometriosis of ovary: Secondary | ICD-10-CM | POA: Insufficient documentation

## 2018-01-24 DIAGNOSIS — N80109 Endometriosis of ovary, unspecified side, unspecified depth: Secondary | ICD-10-CM | POA: Insufficient documentation

## 2020-04-14 IMAGING — MG DIGITAL SCREENING BILATERAL MAMMOGRAM WITH TOMO AND CAD
8 series · 8 of 24 positions shown · non-contrast
Comparison: None.

CLINICAL DATA: Screening.

EXAM:
DIGITAL SCREENING BILATERAL MAMMOGRAM WITH TOMO AND CAD

[L CC synth-2D]
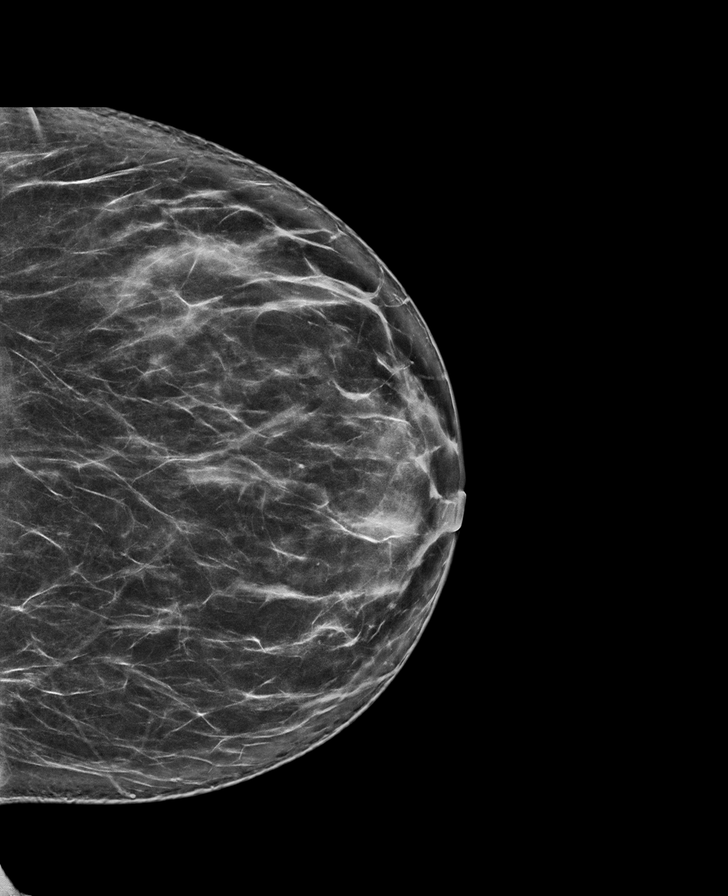

[L MLO synth-2D]
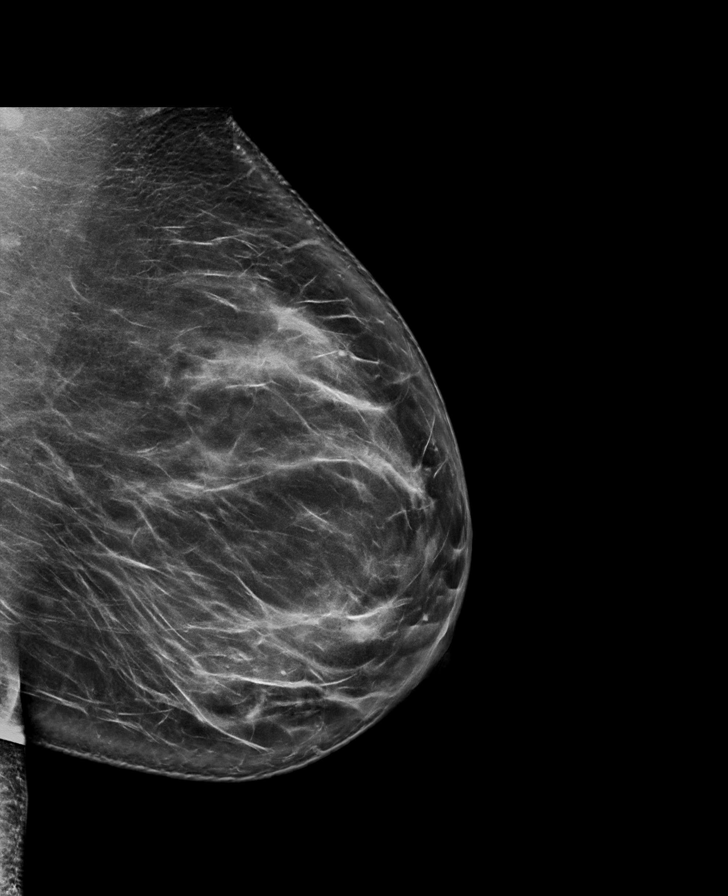

[R CC synth-2D]
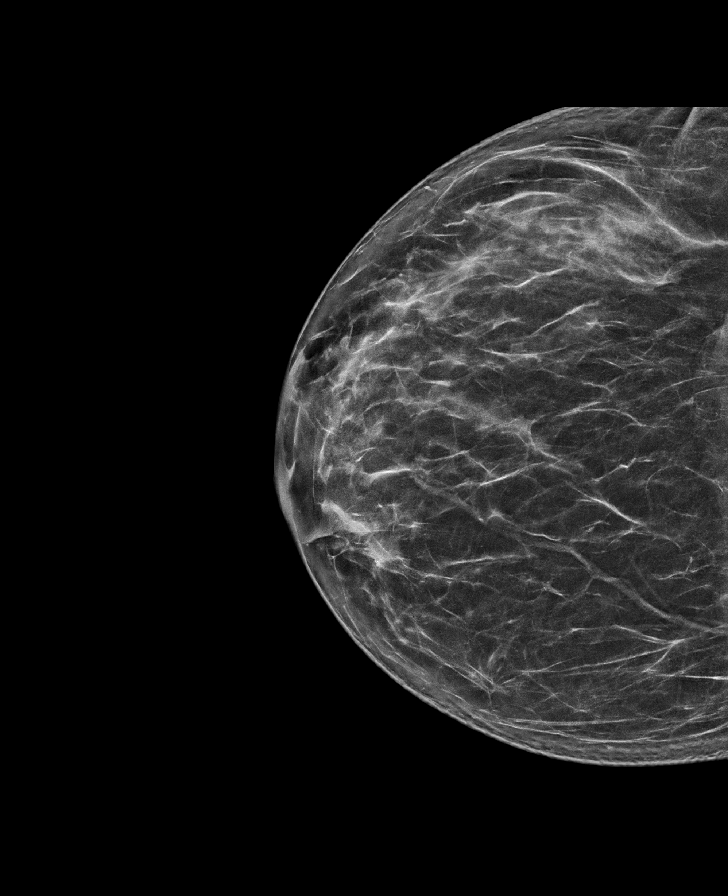

[R MLO synth-2D]
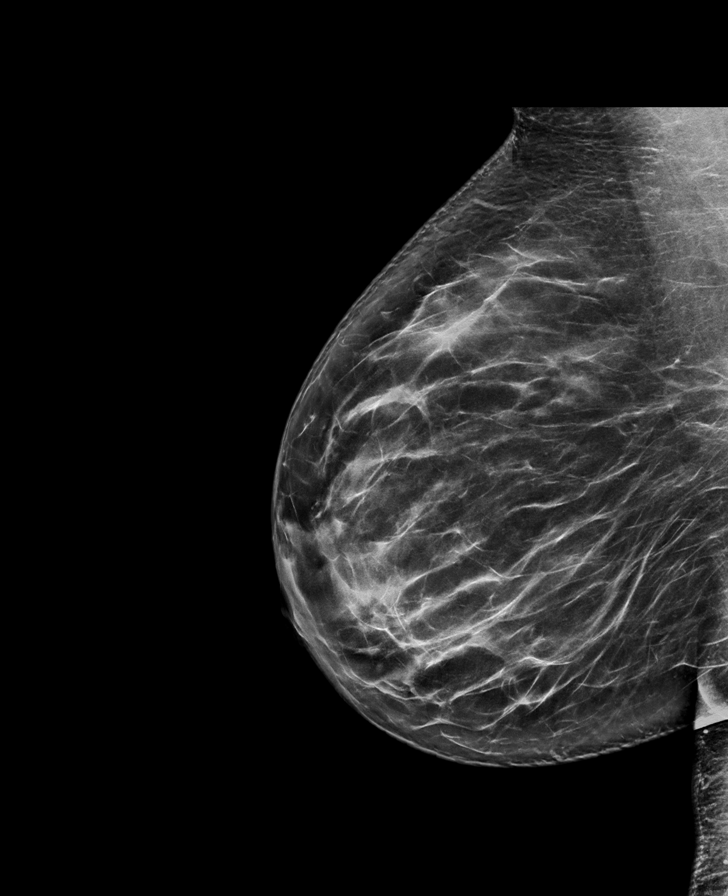

[R MLO tomo · tomo slice 45/88.0]
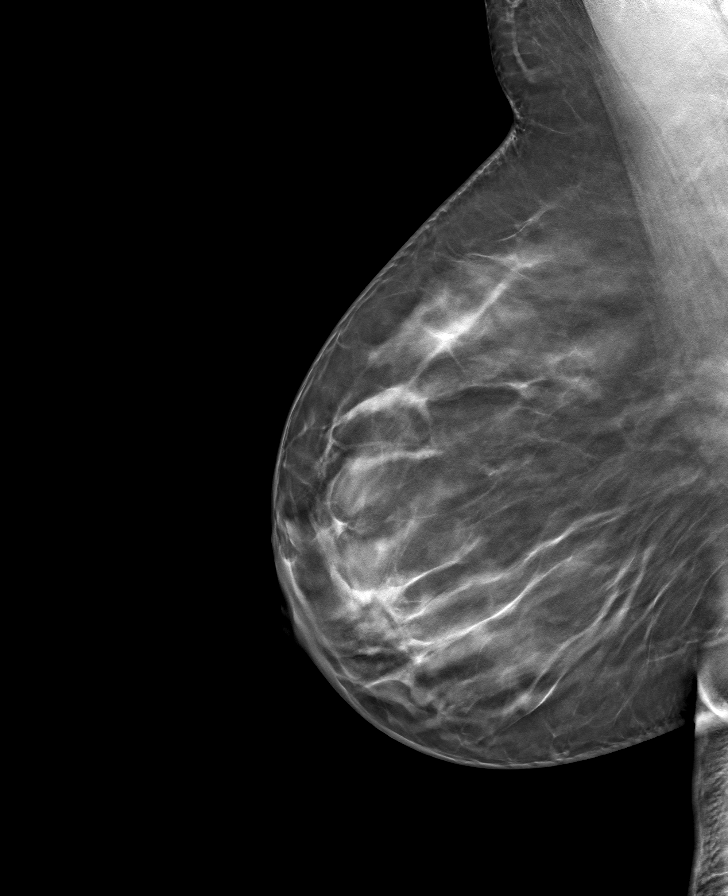

[R CC tomo · tomo slice 39/76.0]
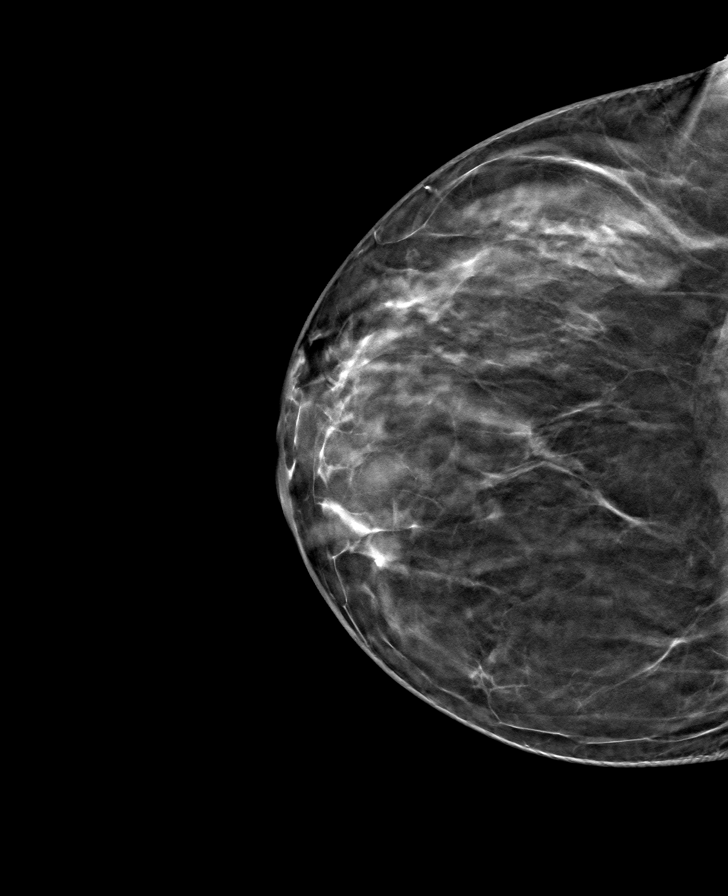

[L MLO tomo · tomo slice 49/97.0]
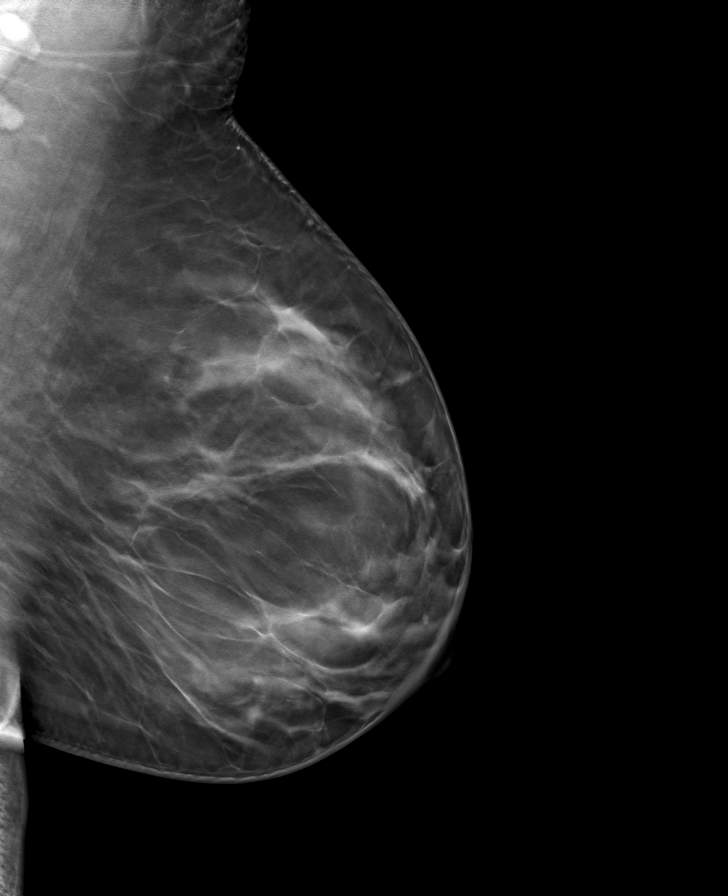

[L CC tomo · tomo slice 43/84.0]
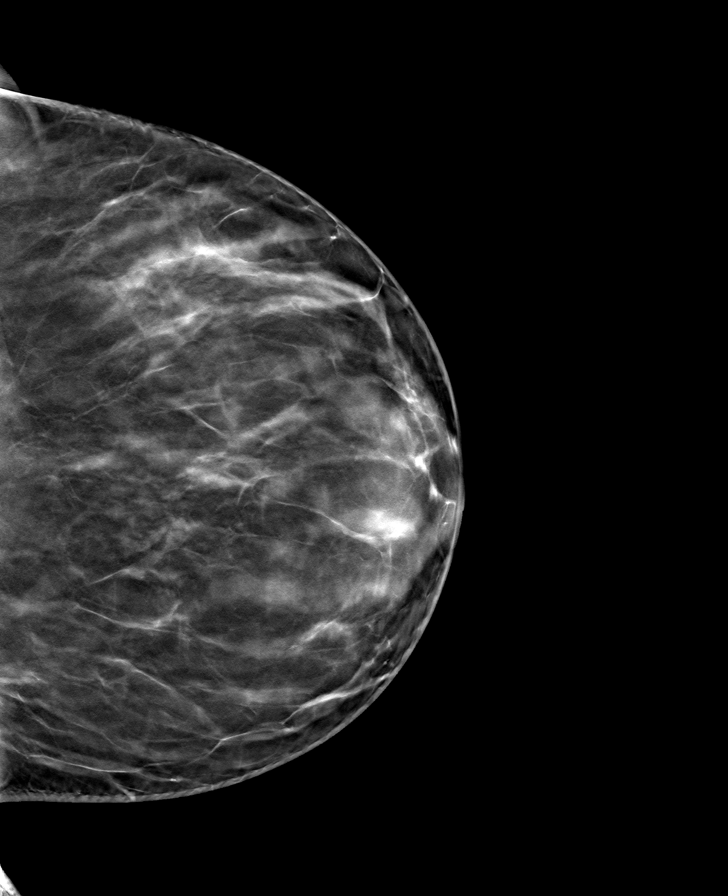

[8 of 24 positions shown; findings below may reference images not displayed]

ACR Breast Density Category b: There are scattered areas of
fibroglandular density.
FINDINGS: There are no findings suspicious for malignancy. Images were
processed with CAD.
IMPRESSION: No mammographic evidence of malignancy. A result letter of this
screening mammogram will be mailed directly to the patient.

RECOMMENDATION:
Screening mammogram in one year. (Code:Y5-G-EJ6)

BI-RADS CATEGORY  1: Negative.

## 2023-11-03 ENCOUNTER — Encounter (HOSPITAL_BASED_OUTPATIENT_CLINIC_OR_DEPARTMENT_OTHER): Payer: Self-pay | Admitting: Emergency Medicine

## 2023-11-03 ENCOUNTER — Emergency Department (HOSPITAL_BASED_OUTPATIENT_CLINIC_OR_DEPARTMENT_OTHER): Payer: Self-pay

## 2023-11-03 ENCOUNTER — Other Ambulatory Visit: Payer: Self-pay

## 2023-11-03 ENCOUNTER — Emergency Department (HOSPITAL_BASED_OUTPATIENT_CLINIC_OR_DEPARTMENT_OTHER)
Admission: EM | Admit: 2023-11-03 | Discharge: 2023-11-03 | Disposition: A | Discharge status: Home / Self Care | Payer: Self-pay | Attending: Emergency Medicine | Admitting: Emergency Medicine

## 2023-11-03 DIAGNOSIS — J45909 Unspecified asthma, uncomplicated: Secondary | ICD-10-CM | POA: Insufficient documentation

## 2023-11-03 DIAGNOSIS — M25532 Pain in left wrist: Secondary | ICD-10-CM | POA: Insufficient documentation

## 2023-11-03 MED ORDER — ACETAMINOPHEN 500 MG PO TABS
1000.0000 mg | ORAL_TABLET | Freq: Once | ORAL | Status: AC
  Administered 2023-11-03: 1000 mg via ORAL
  Filled 2023-11-03: qty 2

## 2023-11-03 MED ORDER — CELECOXIB 200 MG PO CAPS: 200.0000 mg | ORAL_CAPSULE | Freq: Two times a day (BID) | ORAL | 0 refills | Status: AC | PRN

## 2023-11-03 NOTE — Discharge Instructions (Addendum)
 As discussed, x-ray showed no obvious fracture or dislocation.  Will recommend continue using your wrist brace at home.  Will send in anti-inflammatories to help with the pain/inflammation.  Recommend using ice of your wrist to help with pain and swelling as well.  Will attach information for a hand orthopedic specialist to follow-up with.  Please do not hesitate to return if the worrisome signs and symptoms we discussed become apparent.

## 2023-11-03 NOTE — ED Notes (Signed)
 Pt discharged home after verbalizing understanding of discharge instructions; nad noted.

## 2023-11-03 NOTE — ED Triage Notes (Signed)
 Left wrist pain Started yesterday Bought a brace but now has swelling and shooting pains Worse after cooking today Denies known injury

## 2023-11-03 NOTE — ED Provider Notes (Signed)
 [place] EM[facility] DEPARTMENT AT [facility] Provider Note   CSN: [id number] Arrival date & time: 11/03/23  2040     Patient presents with: Wrist Pain   Janice Reeves is a 48 y.o. female.    Wrist Pain   48 year old female presents emergency department complaints of left wrist pain.  States that it began yesterday when she woke up.  Wore a wrist brace yesterday which did help.  Was cooking today and it was worsened.  States she is right-handed.  Denies any known falls/injuries.  Does report tingling in her left fingers.  Did take turmeric at home which she states did help.  Past medical history significant for asthma  Prior to Admission medications   Medication Sig Start Date End Date Taking? Authorizing Provider  b complex vitamins tablet Take 1 tablet by mouth daily.    [provider]  HYDROcodone -acetaminophen  (VICODIN) 5-500 MG per tablet Take 1-2 tablets by mouth every 6 (six) hours as needed for pain. Patient not taking: Reported on 07/04/2015 11/06/11   Latisha Medford, MD  ibuprofen  (ADVIL ) 200 MG tablet Take 3 tablets (600 mg total) by mouth every 6 (six) hours as needed for pain. 11/06/11   Latisha Medford, MD  Multiple Vitamins-Minerals (MULTIVITAMIN WITH MINERALS) tablet Take 1 tablet by mouth daily.    [provider]    Allergies: Dairy aid [tilactase], Gluten meal, and Sulfa antibiotics    Review of Systems  All other systems reviewed and are negative.   Updated Vital Signs BP 120/83 (BP Location: Right Arm)   Pulse 65   Temp 98.4 F (36.9 C) (Oral)   Resp 19   LMP 10/03/2023 (Approximate)   SpO2 98%   Physical Exam Vitals and nursing note reviewed.  Constitutional:      General: She is not in acute distress.    Appearance: She is well-developed.  HENT:     Head: Normocephalic and atraumatic.  Eyes:     Conjunctiva/sclera: Conjunctivae normal.  Cardiovascular:     Rate and Rhythm: Normal rate and regular rhythm.      Heart sounds: No murmur heard. Pulmonary:     Effort: Pulmonary effort is normal. No respiratory distress.     Breath sounds: Normal breath sounds.  Abdominal:     Palpations: Abdomen is soft.     Tenderness: There is no abdominal tenderness.  Musculoskeletal:        General: No swelling.     Cervical back: Neck supple.     Comments: Tender to palpation distal radius/ulna.  Able to range left wrist as well as digits of left hand.  Radial pulses 2+ bilaterally.  No overlying erythema, palpable fluctuance/induration.  Skin:    General: Skin is warm and dry.     Capillary Refill: Capillary refill takes less than 2 seconds.  Neurological:     Mental Status: She is alert.  Psychiatric:        Mood and Affect: Mood normal.     (all labs ordered are listed, but only abnormal results are displayed) Labs Reviewed - No data to display  EKG: None  Radiology: DG Wrist Complete Left Result Date: 11/03/2023 CLINICAL DATA:  Left wrist pain EXAM: LEFT WRIST - COMPLETE 3+ VIEW COMPARISON:  None Available. FINDINGS: There is no evidence of fracture or dislocation. There is no evidence of arthropathy or other focal bone abnormality. Soft tissues are unremarkable. IMPRESSION: Negative. Electronically Signed   By: Franky Crease M.D.   On: 11/03/2023  22:10     Procedures   Medications Ordered in the ED  acetaminophen  (TYLENOL ) tablet 1,000 mg (1,000 mg Oral Given 11/03/23 2213)                                    Medical Decision Making Amount and/or Complexity of Data Reviewed Radiology: ordered.  Risk OTC drugs.   This patient presents to the ED for concern of wrist pain, this involves an extensive number of treatment options, and is a complaint that carries with it a high risk of complications and morbidity.  The differential diagnosis includes fracture, strain/sprain, dislocation, ligamentous/tendinous injury, neurovasc compromise, septic arthritis, osteoarthritis, gout, ischemic limb,  other   Co morbidities that complicate the patient evaluation  See HPI   Additional history obtained:  Additional history obtained from EMR External records from outside source obtained and reviewed including hospital records   Lab Tests:  N/a   Imaging Studies ordered:  I ordered imaging studies including x-ray left wrist I independently visualized and interpreted imaging which showed negative I agree with the radiologist interpretation   Cardiac Monitoring: / EKG:  N/a   Consultations Obtained:  N/a   Problem List / ED Course / Critical interventions / Medication management  Left wrist pain I ordered medication including Tylenol    Reevaluation of the patient after these medicines showed that the patient improved I have reviewed the patients home medicines and have made adjustments as needed   Social Determinants of Health:  Denies tobacco, illicit drug use.   Test / Admission - Considered:  Left wrist pain Vitals signs within normal range and stable throughout visit. Imaging studies significant for: See above 48 year old female presents emergency department complaints of left wrist pain.  States that it began yesterday when she woke up.  Wore a wrist brace yesterday which did help.  Was cooking today and it was worsened.  States she is right-handed.  Denies any known falls/injuries.  Does report tingling in her left fingers.  Did take turmeric at home which she states did help. On exam, tender palpation distal radius and ulna.  No evidence clinically of neurovascular compromise.  X-ray obtained showed no obvious fracture or dislocation.  Suspect wrist sprain.  Patient does have brace at home.  Recommend rest, ice, elevation, anti-inflammatories and follow-up with primary care/hand orthopedic.  Treatment plan discussed with patient and she knowledge understanding was agreeable.  Patient well-appearing, afebrile in no acute distress upon discharge. Worrisome  signs and symptoms were discussed with the patient, and the patient acknowledged understanding to return to the ED if noticed. Patient was stable upon discharge.       Final diagnoses:  None    ED Discharge Orders     None          Silver Wonda LABOR, PA 11/03/23 2225    Lenor Hollering, MD 11/03/23 [phone]
